# Patient Record
Sex: Male | Born: 1995 | Race: White | Hispanic: No | Marital: Single | Smoking: Never smoker
Health system: Southern US, Community
[De-identification: ages and names within clinical notes are randomized; demographics above are authoritative.]

## PROBLEM LIST (undated history)

## (undated) DIAGNOSIS — F419 Anxiety disorder, unspecified: Secondary | ICD-10-CM

## (undated) HISTORY — PX: KNEE SURGERY: SHX244

## (undated) HISTORY — DX: Anxiety disorder, unspecified: F41.9

---
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
## 2018-01-04 ENCOUNTER — Ambulatory Visit
Admission: EM | Admit: 2018-01-04 | Discharge: 2018-01-04 | Disposition: A | Discharge status: Home / Self Care | Payer: Managed Care, Other (non HMO) | Attending: Family Medicine | Admitting: Family Medicine

## 2018-01-04 DIAGNOSIS — R51 Headache: Secondary | ICD-10-CM

## 2018-01-04 DIAGNOSIS — R509 Fever, unspecified: Secondary | ICD-10-CM | POA: Diagnosis not present

## 2018-01-04 DIAGNOSIS — R110 Nausea: Secondary | ICD-10-CM | POA: Diagnosis not present

## 2018-01-04 DIAGNOSIS — R5383 Other fatigue: Secondary | ICD-10-CM | POA: Diagnosis not present

## 2018-01-04 MED ORDER — DOXYCYCLINE HYCLATE 100 MG PO CAPS: 100.0000 mg | ORAL_CAPSULE | Freq: Two times a day (BID) | ORAL | 0 refills | Status: DC

## 2018-01-04 NOTE — ED Triage Notes (Signed)
Pt has been feeling bad for the past 3 days along with fever. Did take ibuprofen without relief. Headache and fatigue. Bit by a tick the other day, states the symptoms started right after that. Did have [place] fever before when he was younger.

## 2018-01-04 NOTE — ED Provider Notes (Signed)
[facility]  CSN: [id number] Arrival date & time: 01/04/18  0804  History   Chief Complaint Chief Complaint  Patient presents with  . Fever   HPI  22 year old male presents with fever.  Patient reports a 3-day history of not feeling well.  He states that he found a tick on him but does not report an actual bite.  He has had a low-grade fever which spiked last night to 102.2.  He reports headache, nausea, continued fever, and body aches.  He is concerned that he may have [facility] spotted fever.  He has taken ibuprofen and took a cold shower without resolution.  No known exacerbating factors.  No other associated symptoms.  No other complaints.  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 KNEE SURGERY      Home Medications    Prior to Admission medications   Medication Sig Start Date End Date Taking? Authorizing Provider  doxycycline (VIBRAMYCIN) 100 MG capsule Take 1 capsule (100 mg total) by mouth 2 (two) times daily. 01/04/18   Tommie Sams, DO   Family History No reported family hx.  Social History Social History   Tobacco Use  . Smoking status: Never Smoker  . Smokeless tobacco: Never Used  Substance Use Topics  . Alcohol use: Not on file  . Drug use: Not on file   Allergies   Patient has no known allergies.  Review of Systems Review of Systems  Constitutional: Positive for fatigue and fever.  Gastrointestinal: Positive for nausea.  Musculoskeletal:       Body aches.   Neurological: Positive for headaches.   Physical Exam Triage Vital Signs ED Triage Vitals  Enc Vitals Group     BP 01/04/18 0818 135/74     Pulse Rate 01/04/18 0818 (!) 105     Resp 01/04/18 0818 18     Temp 01/04/18 0818 99.7 F (37.6 C)     Temp Source 01/04/18 0818 Oral     SpO2 01/04/18 0818 98 %     Weight 01/04/18 0820 160 lb (72.6 kg)     Height --      Head Circumference --      Peak Flow --      Pain Score 01/04/18  0820 6     Pain Loc --      Pain Edu? --      Excl. in GC? --    Updated Vital Signs BP 135/74 (BP Location: Left Arm)   Pulse (!) 105   Temp 99.7 F (37.6 C) (Oral)   Resp 18   Wt 160 lb (72.6 kg)   SpO2 98%     Physical Exam  Constitutional: He is oriented to person, place, and time. He appears well-developed. No distress.  HENT:  Head: Normocephalic and atraumatic.  Eyes: Conjunctivae are normal. Right eye exhibits no discharge. Left eye exhibits no discharge. No scleral icterus.  Cardiovascular: Regular rhythm.  Tachycardic.  Pulmonary/Chest: Effort normal. He has no wheezes. He has no rales.  Neurological: He is alert and oriented to person, place, and time.  Psychiatric: He has a normal mood and affect. His behavior is normal.  Nursing note and vitals reviewed.  UC Treatments / Results  Labs (all labs ordered are listed, but only abnormal results are displayed) Labs Reviewed - No data to display  EKG None  Radiology No results found.  Procedures Procedures (including critical care time)  Medications Ordered  in UC Medications - No data to display  Initial Impression / Assessment and Plan / UC Course  I have reviewed the triage vital signs and the nursing notes.  Pertinent labs & imaging results that were available during my care of the patient were reviewed by me and considered in my medical decision making (see chart for details).    22 year old male presents with a febrile illness.  Patient concerned about RMSF.  Etiology is unclear at this time.  I am placing him on doxycycline given tick exposure and his symptomatology.  Final Clinical Impressions(s) / UC Diagnoses   Final diagnoses:  Febrile illness     Discharge Instructions     Antibiotic as prescribed.  Take care  Dr. Adriana Simas    ED Prescriptions    Medication Sig Dispense Auth. Provider   doxycycline (VIBRAMYCIN) 100 MG capsule Take 1 capsule (100 mg total) by mouth 2 (two) times daily.  20 capsule Tommie Sams, DO     Controlled Substance Prescriptions [place] Controlled Substance Registry consulted? Not Applicable   Tommie Sams, [place] 01/04/18 [id number]

## 2018-01-04 NOTE — Discharge Instructions (Signed)
Antibiotic as prescribed.  Take care  Dr. Alaya Iverson  

## 2018-09-15 ENCOUNTER — Ambulatory Visit
Admission: EM | Admit: 2018-09-15 | Discharge: 2018-09-15 | Disposition: A | Discharge status: Home / Self Care | Payer: Managed Care, Other (non HMO) | Attending: Family Medicine | Admitting: Family Medicine

## 2018-09-15 ENCOUNTER — Other Ambulatory Visit: Payer: Self-pay

## 2018-09-15 ENCOUNTER — Encounter: Payer: Self-pay | Admitting: Gynecology

## 2018-09-15 DIAGNOSIS — Z20818 Contact with and (suspected) exposure to other bacterial communicable diseases: Secondary | ICD-10-CM | POA: Insufficient documentation

## 2018-09-15 LAB — RAPID STREP SCREEN (MED CTR MEBANE ONLY): Streptococcus, Group A Screen (Direct): NEGATIVE

## 2018-09-15 MED ORDER — AMOXICILLIN 875 MG PO TABS: 875.0000 mg | ORAL_TABLET | Freq: Two times a day (BID) | ORAL | 0 refills | Status: DC

## 2018-09-15 NOTE — ED Provider Notes (Signed)
[facility]    CSN: [id number] Arrival date & time: 09/15/18  1234     History   Chief Complaint No chief complaint on file.   HPI Larry Rodriguez is a 22 y.o. male.   HPI  -year-old male presents with a sore throat and fever and chills.  He states that he was exposed to strep when he took his younger sister age 13 to the pediatrician and she was diagnosed with strep throat.  That he has had strep in the past.  No cough.  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      History reviewed. No pertinent past medical history.  There are no active problems to display for this patient.   Past Surgical History:  Procedure Laterality Date  . KNEE SURGERY         Home Medications    Prior to Admission medications   Medication Sig Start Date End Date Taking? Authorizing Provider  acetaminophen (TYLENOL) 325 MG tablet Take by mouth.   Yes [provider]  amoxicillin (AMOXIL) 875 MG tablet Take 1 tablet (875 mg total) by mouth 2 (two) times daily. 09/15/18   Lutricia Feil, PA-C    Family History Family History  Family history unknown: Yes    Social History Social History   Tobacco Use  . Smoking status: Never Smoker  . Smokeless tobacco: Never Used  Substance Use Topics  . Alcohol use: Yes  . Drug use: Never     Allergies   Patient has no known allergies.   Review of Systems Review of Systems  Constitutional: Positive for activity change, chills, fatigue and fever. Negative for appetite change.  HENT: Positive for sore throat.   Respiratory: Negative for cough.   All other systems reviewed and are negative.    Physical Exam Triage Vital Signs ED Triage Vitals  Enc Vitals Group     BP 09/15/18 1250 (!) 89/69     Pulse Rate 09/15/18 1250 95     Resp 09/15/18 1250 18     Temp 09/15/18 1250 99.5 F (37.5 C)     Temp Source 09/15/18 1250 Oral     SpO2 09/15/18 1250 100  %     Weight 09/15/18 1251 175 lb (79.4 kg)     Height 09/15/18 1251 6\' 3"  (1.905 m)     Head Circumference --      Peak Flow --      Pain Score 09/15/18 1251 4     Pain Loc --      Pain Edu? --      Excl. in GC? --    No data found.  Updated Vital Signs BP (!) 89/69 (BP Location: Left Arm)   Pulse 95   Temp 99.5 F (37.5 C) (Oral)   Resp 18   Ht 6\' 3"  (1.905 m)   Wt 175 lb (79.4 kg)   SpO2 100%   BMI 21.87 kg/m   Visual Acuity Right Eye Distance:   Left Eye Distance:   Bilateral Distance:    Right Eye Near:   Left Eye Near:    Bilateral Near:     Physical Exam Vitals signs and nursing note reviewed.  Constitutional:      General: He is not in acute distress.    Appearance: Normal appearance. He is normal weight. He is not ill-appearing, toxic-appearing or diaphoretic.  HENT:     Head:  Normocephalic.     Comments: The patient has bilateral enlarged tonsils.  No erythema or exudate.  Uvula is midline.    Right Ear: Tympanic membrane, ear canal and external ear normal.     Left Ear: Tympanic membrane, ear canal and external ear normal.     Nose: Nose normal.     Mouth/Throat:     Mouth: Mucous membranes are moist.     Pharynx: Oropharynx is clear. No oropharyngeal exudate or posterior oropharyngeal erythema.  Eyes:     General:        Right eye: No discharge.        Left eye: No discharge.     Conjunctiva/sclera: Conjunctivae normal.  Neck:     Musculoskeletal: Normal range of motion and neck supple.  Pulmonary:     Effort: Pulmonary effort is normal.     Breath sounds: Normal breath sounds.  Musculoskeletal: Normal range of motion.  Lymphadenopathy:     Cervical: No cervical adenopathy.  Skin:    General: Skin is warm and dry.  Neurological:     General: No focal deficit present.     Mental Status: He is alert and oriented to person, place, and time.  Psychiatric:        Mood and Affect: Mood normal.        Behavior: Behavior normal.        Thought  Content: Thought content normal.        Judgment: Judgment normal.      UC Treatments / Results  Labs (all labs ordered are listed, but only abnormal results are displayed) Labs Reviewed  RAPID STREP SCREEN ([facility] ONLY)  CULTURE, GROUP A STREP [facility])    EKG None  Radiology No results found.  Procedures Procedures (including critical care time)  Medications Ordered in UC Medications - No data to display  Initial Impression / Assessment and Plan / UC Course  I have reviewed the triage vital signs and the nursing notes.  Pertinent labs & imaging results that were available during my care of the patient were reviewed by me and considered in my medical decision making (see chart for details).   This is a 23 year old male has a positive exposure to strep when he took his younger sister age 79 to pediatrician.  History of recurrent strep infections.  Empirically place him on amoxicillin awaiting cultures and sensitivities.  Counseled him regarding use of ibuprofen salt water gargles and lozenges as necessary for comfort.   Final Clinical Impressions(s) / UC Diagnoses   Final diagnoses:  Exposure to strep throat   Discharge Instructions   None    ED Prescriptions    Medication Sig Dispense Auth. Provider   amoxicillin (AMOXIL) 875 MG tablet Take 1 tablet (875 mg total) by mouth 2 (two) times daily. 20 tablet Lutricia Feil, PA-C     Controlled Substance Prescriptions [place] Controlled Substance Registry consulted? Not Applicable   Lutricia Feil, PA-C 09/15/18 1429

## 2018-09-15 NOTE — ED Triage Notes (Signed)
Patient c/o sore throat. Per patient younger sister with diagnose with strep.

## 2018-09-18 LAB — CULTURE, GROUP A STREP (THRC)

## 2019-08-27 ENCOUNTER — Other Ambulatory Visit: Payer: Self-pay | Admitting: Adult Health

## 2019-08-27 ENCOUNTER — Other Ambulatory Visit: Payer: Self-pay

## 2019-08-27 ENCOUNTER — Telehealth: Payer: Self-pay | Admitting: Adult Health

## 2019-08-27 ENCOUNTER — Ambulatory Visit (INDEPENDENT_AMBULATORY_CARE_PROVIDER_SITE_OTHER): Payer: Managed Care, Other (non HMO) | Admitting: Adult Health

## 2019-08-27 ENCOUNTER — Encounter: Payer: Self-pay | Admitting: Adult Health

## 2019-08-27 VITALS — BP 136/88 | HR 75 | Temp 96.8°F | Resp 16 | Ht 76.0 in | Wt 194.4 lb

## 2019-08-27 DIAGNOSIS — Z23 Encounter for immunization: Secondary | ICD-10-CM | POA: Diagnosis not present

## 2019-08-27 DIAGNOSIS — Q8740 Marfan's syndrome, unspecified: Secondary | ICD-10-CM

## 2019-08-27 DIAGNOSIS — Z0000 Encounter for general adult medical examination without abnormal findings: Secondary | ICD-10-CM | POA: Diagnosis not present

## 2019-08-27 DIAGNOSIS — F419 Anxiety disorder, unspecified: Secondary | ICD-10-CM | POA: Insufficient documentation

## 2019-08-27 MED ORDER — ESCITALOPRAM OXALATE 10 MG PO TABS: 10.0000 mg | ORAL_TABLET | Freq: Every day | ORAL | 0 refills | Status: DC

## 2019-08-27 NOTE — Telephone Encounter (Signed)
See note below KW 

## 2019-08-27 NOTE — Progress Notes (Signed)
Patient: Larry Rodriguez, Male    DOB: March 15, 1996, 24 y.o.   MRN: [id number] Visit Date: 08/27/2019  Today's Provider: Marcille Buffy, FNP   Chief Complaint  Patient presents with  . New Patient (Initial Visit)   Subjective:    New Patient Larry Rodriguez is a 24 y.o. male who presents today for health maintenance and establish care as a new patient. He feels well. He reports exercising daily. He reports he is sleeping fairly well.  Patient comes to the clinic for establishment of primary care, he would like to have labs drawn today, he reports he is fasting.  He has no concerns except for that he has been having occasional anxiety attacks, and feeling overwhelmed.  He most recently bought a new house, purchased a new car, became engaged, and has been stressed with the COVID-19 pandemic this past year. He denies any palpitations.  Denies any shortness of breath. Denies any dyspnea.  Patient  denies any fever, body aches,chills, rash, chest pain, shortness of breath, nausea, vomiting, or diarrhea.  He denies any changing skin lesions or concerns. Denies any chest pain or chest tightness.  Eye exam and dentist yearly. Wears glasses.   He has a history of Marfan's documented.  He denies any concerns or changes. Denies any pain at today's visit. He keeps follow up's with specialist at [facility] and sees orthopedics.  Reviewed imaging prior patient had at [facility].  Patellar instability of left knee (Primary Dx);  Marfan's syndrome with skeletal manifestation; 24 y.o. male who follows-up status post left tibial tubercle osteotomy with medial plica excision, medial patellofemoral ligament reconstruction on 09/16/13. Past Surgical History  Procedure Laterality Date  . Dental surgery 2006  . Reconstruction dislocating patella Left 09/16/2013  Procedure: RECONSTRUCTION DISLOCATING PATELLA; Surgeon: Amador Cunas, MD; Location: [facility] OR; Service: Orthopedics; Laterality: Left;  .  Tubercleplasty anterior tibia Left 09/16/2013  Procedure: TUBERCLEPLASTY ANTERIOR TIBIA; Surgeon: Amador Cunas, MD; Location: [facility] OR; Service: Orthopedics; Laterality: Left;  . Arthroscopy knee Left 09/16/2013  Procedure: KNEE SCOPE,PART SYNOVECT; Surgeon: Amador Cunas, MD; Location: [facility] OR; Service: Orthopedics; Laterality: Left;   Interface, Rad Results In - 10/13/2014  5:14 PM EST 2 views of the left tibia.  Indication: Pain.  Comparison: 12/08/2013.  Findings: Stable appearance of 2 screws in the proximal left tibia, no evidence for hardware failure. Apparent osteopenia is identified in the region of the screws on lateral view only, is without correlate on the frontal view and favored artifactual. Old screw tracks are identified in the distal femur and patella. Regional soft tissues are unremarkable. 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  Impression: Stable postsurgical changes of the left tibia without evidence for hardware failure.  Electronically Reviewed by:  Henrine Screws, MD Electronically Reviewed on:  10/13/2014 5:08 PM  I have reviewed the images and concur with the above findings.  Electronically Signed by:  Berna Spare, MD Electronically Signed on:  10/13/2014 5:11 PM [facility]                       [address]                      [place], [place] [address]                     Phone [phone]  Fax [phone]                Pediatric Echo Transthoracic Report  Name: Larry Rodriguez Date: 03/07/2010 10:04 AM MRN: [id number]         Patient Location: Pediatric Echo Lab     Height: 170 cm DOB: [birth year]       Gender: Male                 Weight: 41 kg Age: 78 yrs                                      BSA:  1.4 meters2 Reason For Study: 759.82 MARFAN SYNDROME Performed By: MD Donnetta Hail  INTERPRETATION SUMMARY  No cardiac disease identified.  CARDIAC POSITION  Levocardia. Abdominal situs solitus. Atrial situs solitus. D Ventricular Loop. S Normal  position great vessels.  VEINS  Normal systemic venous connections. At least two pulmonary veins are seen connecting to  the left atrium. Normal pulmonary vein velocity.  ATRIA  Normal right atrial size. Normal left atrial size. The atrial septum is not well seen.  ATRIOVENTRICULAR VALVES  Normal tricuspid valve. Normal tricuspid valve inflow velocity. Trace tricuspid valve  insufficiency. Normal mitral valve. Normal mitral valve inflow velocity. No mitral valve  insufficiency.  VENTRICLES  Normal right ventricle structure and size. Normal left ventricle structure and size.  Intact ventricular septum.  CARDIAC FUNCTION  Normal right ventricular systolic function. Normal left ventricular systolic function.  SEMILUNAR VALVES  Normal pulmonic valve. Trivial pulmonary valve insufficiency. Normal pulmonic valve  velocity. Aortic valve mobility appears normal. Normal aortic valve velocity by Doppler.  No aortic valve insufficiency by color Doppler.  CORONARY ARTERIES  Normal origin and proximal course of the right coronary artery with prograde flow  demonstrated by color Doppler. Normal origin and proximal course of the left coronary  artery with prograde flow demonstrated by color Doppler.  GREAT ARTERIES  Left aortic arch with normal branching pattern. No evidence of coarctation of the aorta.  Normal pulmonary artery branches.  SHUNTS  No patent ductus arteriosus.  Patient  denies any fever, body aches,chills, rash, chest pain, shortness of breath, nausea, vomiting, or diarrhea.   -----------------------------------------------------------------   Review of Systems  Constitutional: Negative.         History of Marfan's reports followed at [facility]. No changes or concerns. No pain at today's visit.   HENT: Negative.   Eyes: Negative.   Respiratory: Negative.   Cardiovascular: Negative.   Gastrointestinal: Negative.   Endocrine: Negative.   Genitourinary: Negative.   Musculoskeletal: Negative.   Skin: Negative.   Allergic/Immunologic: Negative.   Neurological: Negative.   Hematological: Negative.   Psychiatric/Behavioral: Positive for decreased concentration. Negative for agitation, behavioral problems, confusion, dysphoric mood, hallucinations, self-injury, sleep disturbance and suicidal ideas. The patient is nervous/anxious. The patient is not hyperactive.        Anxiety attack- overwhelmed with new house ,new car, just engaged, new job,  Feels overwhelmed at time.  Denies any suicidal or homicidal ideations or intents.   All other systems reviewed and are negative.   Social History He  reports that he has never smoked. He has never used smokeless tobacco. He reports current alcohol use of about 2.0 standard drinks of alcohol per week. He reports that he does not use drugs. Social History   Socioeconomic History  .  Marital status: Single    Spouse name: Not on file  . Number of children: Not on file  . Years of education: Not on file  . Highest education level: Not on file  Occupational History  . Not on file  Tobacco Use  . Smoking status: Never Smoker  . Smokeless tobacco: Never Used  Substance and Sexual Activity  . Alcohol use: Yes    Alcohol/week: 2.0 standard drinks    Types: 2 Cans of beer per week  . Drug use: Never  . Sexual activity: Not on file  Other Topics Concern  . Not on file  Social History Narrative  . Not on file   Social Determinants of Health   Financial Resource Strain:   . Difficulty of Paying Living Expenses: Not on file  Food Insecurity:   . Worried About Charity fundraiser in the Last Year: Not on file  . Ran Out of Food in the Last  Year: Not on file  Transportation Needs:   . Lack of Transportation (Medical): Not on file  . Lack of Transportation (Non-Medical): Not on file  Physical Activity:   . Days of Exercise per Week: Not on file  . Minutes of Exercise per Session: Not on file  Stress:   . Feeling of Stress : Not on file  Social Connections:   . Frequency of Communication with Friends and Family: Not on file  . Frequency of Social Gatherings with Friends and Family: Not on file  . Attends Religious Services: Not on file  . Active Member of Clubs or Organizations: Not on file  . Attends Archivist Meetings: Not on file  . Marital Status: Not on file    Patient Active Problem List   Diagnosis Date Noted  . Anxiety 08/27/2019  . Routine health maintenance 08/27/2019  . Pain in shin, right 10/16/2014  . 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 KNEE SURGERY      Family History  Family Status  Relation Name Status  . Mother  Alive  . Father  Alive  . MGM  (Not Specified)  . MGF  (Not Specified)   His family history includes Cancer in his maternal grandfather; Congestive Heart Failure in his maternal grandmother; Diabetes in his maternal grandmother.     No Known Allergies  Previous Medications   No medications on file    Patient Care Team: Doreen Beam, FNP as PCP - General (Family Medicine)      Objective:   Vitals: Blood pressure 136/88, pulse 75, temperature (!) 96.8 F (36 C), temperature source Oral, resp. rate 16, height 6' 4"  (1.93 m), weight 194 lb 6.4 oz (88.2 kg), SpO2 98 %.   Physical Exam Vitals and nursing note reviewed.  Constitutional:      General: He is not in acute distress.    Appearance: Normal appearance. He is well-developed. He is not ill-appearing, toxic-appearing or diaphoretic.     Comments: Patient is alert and oriented and responsive to  questions Engages in eye contact with provider. Speaks in full sentences without any pauses without any shortness of breath or distress.   Tall and thin.    HENT:     Head: Normocephalic and atraumatic.     Right Ear: Hearing, tympanic membrane, ear canal and external ear normal.     Left Ear: Hearing, tympanic membrane, ear canal and  external ear normal.     Nose: Nose normal.     Mouth/Throat:     Lips: Pink.     Mouth: Mucous membranes are moist.     Palate: No mass.     Pharynx: Oropharynx is clear. Uvula midline. No oropharyngeal exudate.     Tonsils: 1+ on the right. 1+ on the left.  Eyes:     General: Lids are normal. No scleral icterus.       Right eye: No discharge.        Left eye: No discharge.     Conjunctiva/sclera: Conjunctivae normal.     Pupils: Pupils are equal, round, and reactive to light.  Neck:     Thyroid: No thyromegaly.     Vascular: Normal carotid pulses. No carotid bruit, hepatojugular reflux or JVD.     Trachea: Trachea and phonation normal. No tracheal tenderness or tracheal deviation.     Meningeal: Brudzinski's sign absent.  Cardiovascular:     Rate and Rhythm: Normal rate and regular rhythm.     Pulses: Normal pulses.     Heart sounds: Normal heart sounds, S1 normal and S2 normal. Heart sounds not distant. No murmur. No friction rub. No gallop.   Pulmonary:     Effort: Pulmonary effort is normal. No accessory muscle usage or respiratory distress.     Breath sounds: Normal breath sounds. No stridor. No wheezing or rales.  Chest:     Chest wall: No tenderness.  Abdominal:     General: Bowel sounds are normal. There is no distension.     Palpations: Abdomen is soft. There is no mass.     Tenderness: There is no abdominal tenderness. There is no guarding or rebound.     Hernia: No hernia is present.  Musculoskeletal:        General: No tenderness or deformity. Normal range of motion.     Cervical back: Full passive range of motion without pain,  normal range of motion and neck supple.     Comments: Patient moves on and off of exam table and in room without difficulty. Gait is normal in hall and in room. Patient is oriented to person place time and situation. Patient answers questions appropriately and engages in conversation.   Lymphadenopathy:     Head:     Right side of head: No submental, submandibular, tonsillar, preauricular, posterior auricular or occipital adenopathy.     Left side of head: No submental, submandibular, tonsillar, preauricular, posterior auricular or occipital adenopathy.     Cervical: No cervical adenopathy.  Skin:    General: Skin is warm and dry.     Capillary Refill: Capillary refill takes less than 2 seconds.     Coloration: Skin is not pale.     Findings: No erythema or rash.     Nails: There is no clubbing.  Neurological:     Mental Status: He is alert and oriented to person, place, and time.     GCS: GCS eye subscore is 4. GCS verbal subscore is 5. GCS motor subscore is 6.     Cranial Nerves: No cranial nerve deficit.     Sensory: No sensory deficit.     Motor: No abnormal muscle tone.     Coordination: Coordination normal.     Gait: Gait normal.     Deep Tendon Reflexes: Reflexes are normal and symmetric. Reflexes normal.  Psychiatric:        Speech: Speech normal.  Behavior: Behavior normal.        Thought Content: Thought content normal.        Judgment: Judgment normal.      Depression Screen PHQ 2/9 Scores 08/27/2019  PHQ - 2 Score 3  PHQ- 9 Score 6      Assessment & Plan:     Routine Health Maintenance and Physical Exam  Exercise Activities and Dietary recommendations Goals   None     Immunization History  Administered Date(s) Administered  . DTaP 04/07/1997, 06/08/1997, 08/11/1997, 04/30/1998, 06/18/2001  . HPV Quadrivalent 03/01/2010  . Hepatitis A 04/13/2008, 06/17/2009  . Hepatitis B 09[birth year]11[birth year]96, 03/05/1997, 10/28/1997  . HiB (PRP-OMP) 04/07/1997,  06/08/1997, 04/30/1998  . IPV 04/07/1997, 06/08/1997, 01/29/1998, 06/18/2001  . Influenza,inj,Quad PF,6+ Mos 08/27/2019  . MMR 01/29/1998, 06/18/2001  . Meningococcal Conjugate 04/13/2008  . Tdap 04/13/2008  . Varicella 01/29/1998, 05/25/2008    Health Maintenance  Topic Date Due  . HIV Screening  01/28/2012  . TETANUS/TDAP  04/13/2018  . INFLUENZA VACCINE  Completed     Discussed health benefits of physical activity, and encouraged him to engage in regular exercise appropriate for his age and condition.     1. Routine health maintenance - Comprehensive Metabolic Panel (CMET) - Lipid Panel w/o Chol/HDL Ratio - TSH - CBC with Differential/Platelet 005009  2. Need for influenza vaccination Given VIS and given by CMA.  - 3. Anxiety Meds ordered this encounter  Medications  . escitalopram (LEXAPRO) 10 MG tablet    Sig: Take 1 tablet (10 mg total) by mouth daily.    Dispense:  35 tablet    Refill:  0  discussed black box warning and when to seek care immediately.  He will go to counseling.   - Comprehensive Metabolic Panel (CMET) - Lipid Panel w/o Chol/HDL Ratio - TSH - CBC with Differential/Platelet 005009  4. Marfan syndrome Report any changes. Keep follow up with your specialist at [facility]. Yearly eye exam. Dental exam. Monitor blood pressure. Report any new symptoms.   Please schedule a follow up appointment in 1 month.  Advised patient call the office or your primary care doctor for an appointment if no improvement within 72 hours or if any symptoms change or worsen at any time  Advised ER or urgent Care if after hours or on weekend. Call 911 for emergency symptoms at any time.Patinet verbalized understanding of all instructions given/reviewed and treatment plan and has no further questions or concerns at this time.     The entirety of the information documented in the History of Present Illness, Review of Systems and Physical Exam were personally obtained by me.  Portions of this information were initially documented by the  Certified Medical Assistant whose name is documented in [place] and reviewed by me for thoroughness and accuracy.  I have personally performed the exam and reviewed the chart and it is accurate to the best of my knowledge.  Haematologist has been used and any errors in dictation or transcription are unintentional.  Kelby Aline. [facility]  --------------------------------------------------------------------

## 2019-08-27 NOTE — Telephone Encounter (Signed)
Pt called and stated that he would like a call back from [facility] regarding medication escitalopram (LEXAPRO) 10 MG tablet [[id number]].

## 2019-08-27 NOTE — Patient Instructions (Addendum)
[facility] in [place] - you can schedule online for recommended  counseling.    Escitalopram tablets What is this medicine? ESCITALOPRAM (es sye TAL oh pram) is used to treat depression and certain types of anxiety. This medicine may be used for other purposes; ask your health care provider or pharmacist if you have questions. COMMON BRAND NAME(S): Lexapro What should I tell my health care provider before I take this medicine? They need to know if you have any of these conditions:  bipolar disorder or a family history of bipolar disorder  diabetes  glaucoma  heart disease  kidney or liver disease  receiving electroconvulsive therapy  seizures (convulsions)  suicidal thoughts, plans, or attempt by you or a family member  an unusual or allergic reaction to escitalopram, the related drug citalopram, other medicines, foods, dyes, or preservatives  pregnant or trying to become pregnant  breast-feeding How should I use this medicine? Take this medicine by mouth with a glass of water. Follow the directions on the prescription label. You can take it with or without food. If it upsets your stomach, take it with food. Take your medicine at regular intervals. Do not take it more often than directed. Do not stop taking this medicine suddenly except upon the advice of your doctor. Stopping this medicine too quickly may cause serious side effects or your condition may worsen. A special MedGuide will be given to you by the pharmacist with each prescription and refill. Be sure to read this information carefully each time. Talk to your pediatrician regarding the use of this medicine in children. Special care may be needed. Overdosage: If you think you have taken too much of this medicine contact a poison control center or emergency room at once. NOTE: This medicine is only for you. Do not share this medicine with others. What if I miss a dose? If you miss a dose, take it as soon as  you can. If it is almost time for your next dose, take only that dose. Do not take double or extra doses. What may interact with this medicine? Do not take this medicine with any of the following medications:  certain medicines for fungal infections like fluconazole, itraconazole, ketoconazole, posaconazole, voriconazole  cisapride  citalopram  dronedarone  linezolid  MAOIs like Carbex, Eldepryl, Marplan, Nardil, and Parnate  methylene blue (injected into a vein)  pimozide  thioridazine This medicine may also interact with the following medications:  alcohol  amphetamines  aspirin and aspirin-like medicines  carbamazepine  certain medicines for depression, anxiety, or psychotic disturbances  certain medicines for migraine headache like almotriptan, eletriptan, frovatriptan, naratriptan, rizatriptan, sumatriptan, zolmitriptan  certain medicines for sleep  certain medicines that treat or prevent blood clots like warfarin, enoxaparin, dalteparin  cimetidine  diuretics  dofetilide  fentanyl  furazolidone  isoniazid  lithium  metoprolol  NSAIDs, medicines for pain and inflammation, like ibuprofen or naproxen  other medicines that prolong the QT interval (cause an abnormal heart rhythm)  procarbazine  rasagiline  supplements like St. John's wort, kava kava, valerian  tramadol  tryptophan  ziprasidone This list may not describe all possible interactions. Give your health care provider a list of all the medicines, herbs, non-prescription drugs, or dietary supplements you use. Also tell them if you smoke, drink alcohol, or use illegal drugs. Some items may interact with your medicine. What should I watch for while using this medicine? Tell your doctor if your symptoms do not get better or if they get  worse. Visit your doctor or health care professional for regular checks on your progress. Because it may take several weeks to see the full effects of this  medicine, it is important to continue your treatment as prescribed by your doctor. Patients and their families should watch out for new or worsening thoughts of suicide or depression. Also watch out for sudden changes in feelings such as feeling anxious, agitated, panicky, irritable, hostile, aggressive, impulsive, severely restless, overly excited and hyperactive, or not being able to sleep. If this happens, especially at the beginning of treatment or after a change in dose, call your health care professional. Bonita Quin may get drowsy or dizzy. Do not drive, use machinery, or do anything that needs mental alertness until you know how this medicine affects you. Do not stand or sit up quickly, especially if you are an older patient. This reduces the risk of dizzy or fainting spells. Alcohol may interfere with the effect of this medicine. Avoid alcoholic drinks. Your mouth may get dry. Chewing sugarless gum or sucking hard candy, and drinking plenty of water may help. Contact your doctor if the problem does not go away or is severe. What side effects may I notice from receiving this medicine? Side effects that you should report to your doctor or health care professional as soon as possible:  allergic reactions like skin rash, itching or hives, swelling of the face, lips, or tongue  anxious  black, tarry stools  changes in vision  confusion  elevated mood, decreased need for sleep, racing thoughts, impulsive behavior  eye pain  fast, irregular heartbeat  feeling faint or lightheaded, falls  feeling agitated, angry, or irritable  hallucination, loss of contact with reality  loss of balance or coordination  loss of memory  painful or prolonged erections  restlessness, pacing, inability to keep still  seizures  stiff muscles  suicidal thoughts or other mood changes  trouble sleeping  unusual bleeding or bruising  unusually weak or tired  vomiting Side effects that usually do not  require medical attention (report to your doctor or health care professional if they continue or are bothersome):  changes in appetite  change in sex drive or performance  headache  increased sweating  indigestion, nausea  tremors This list may not describe all possible side effects. Call your doctor for medical advice about side effects. You may report side effects to FDA at 1-800-FDA-1088. Where should I keep my medicine? Keep out of reach of children. Store at room temperature between 15 and 30 degrees C (59 and 86 degrees F). Throw away any unused medicine after the expiration date. NOTE: This sheet is a summary. It may not cover all possible information. If you have questions about this medicine, talk to your doctor, pharmacist, or health care provider.  2020 Elsevier/Gold Standard (2018-07-29 11:21:44)   Generalized Anxiety Disorder, Adult Generalized anxiety disorder (GAD) is a mental health disorder. People with this condition constantly worry about everyday events. Unlike normal anxiety, worry related to GAD is not triggered by a specific event. These worries also do not fade or get better with time. GAD interferes with life functions, including relationships, work, and school. GAD can vary from mild to severe. People with severe GAD can have intense waves of anxiety with physical symptoms (panic attacks). What are the causes? The exact cause of GAD is not known. What increases the risk? This condition is more likely to develop in:  Women.  People who have a family history of anxiety  disorders.  People who are very shy.  People who experience very stressful life events, such as the death of a loved one.  People who have a very stressful family environment. What are the signs or symptoms? People with GAD often worry excessively about many things in their lives, such as their health and family. They may also be overly concerned about:  Doing well at work.  Being on  time.  Natural disasters.  Friendships. Physical symptoms of GAD include:  Fatigue.  Muscle tension or having muscle twitches.  Trembling or feeling shaky.  Being easily startled.  Feeling like your heart is pounding or racing.  Feeling out of breath or like you cannot take a deep breath.  Having trouble falling asleep or staying asleep.  Sweating.  Nausea, diarrhea, or irritable bowel syndrome (IBS).  Headaches.  Trouble concentrating or remembering facts.  Restlessness.  Irritability. How is this diagnosed? Your health care provider can diagnose GAD based on your symptoms and medical history. You will also have a physical exam. The health care provider will ask specific questions about your symptoms, including how severe they are, when they started, and if they come and go. Your health care provider may ask you about your use of alcohol or drugs, including prescription medicines. Your health care provider may refer you to a mental health specialist for further evaluation. Your health care provider will do a thorough examination and may perform additional tests to rule out other possible causes of your symptoms. To be diagnosed with GAD, a person must have anxiety that:  Is out of his or her control.  Affects several different aspects of his or her life, such as work and relationships.  Causes distress that makes him or her unable to take part in normal activities.  Includes at least three physical symptoms of GAD, such as restlessness, fatigue, trouble concentrating, irritability, muscle tension, or sleep problems. Before your health care provider can confirm a diagnosis of GAD, these symptoms must be present more days than they are not, and they must last for six months or longer. How is this treated? The following therapies are usually used to treat GAD:  Medicine. Antidepressant medicine is usually prescribed for long-term daily control. Antianxiety medicines may  be added in severe cases, especially when panic attacks occur.  Talk therapy (psychotherapy). Certain types of talk therapy can be helpful in treating GAD by providing support, education, and guidance. Options include: ? Cognitive behavioral therapy (CBT). People learn coping skills and techniques to ease their anxiety. They learn to identify unrealistic or negative thoughts and behaviors and to replace them with positive ones. ? Acceptance and commitment therapy (ACT). This treatment teaches people how to be mindful as a way to cope with unwanted thoughts and feelings. ? Biofeedback. This process trains you to manage your body's response (physiological response) through breathing techniques and relaxation methods. You will work with a therapist while machines are used to monitor your physical symptoms.  Stress management techniques. These include yoga, meditation, and exercise. A mental health specialist can help determine which treatment is best for you. Some people see improvement with one type of therapy. However, other people require a combination of therapies. Follow these instructions at home:  Take over-the-counter and prescription medicines only as told by your health care provider.  Try to maintain a normal routine.  Try to anticipate stressful situations and allow extra time to manage them.  Practice any stress management or self-calming techniques as taught by  your health care provider.  Do not punish yourself for setbacks or for not making progress.  Try to recognize your accomplishments, even if they are small.  Keep all follow-up visits as told by your health care provider. This is important. Contact a health care provider if:  Your symptoms do not get better.  Your symptoms get worse.  You have signs of depression, such as: ? A persistently sad, cranky, or irritable mood. ? Loss of enjoyment in activities that used to bring you joy. ? Change in weight or  eating. ? Changes in sleeping habits. ? Avoiding friends or family members. ? Loss of energy for normal tasks. ? Feelings of guilt or worthlessness. Get help right away if:  You have serious thoughts about hurting yourself or others. If you ever feel like you may hurt yourself or others, or have thoughts about taking your own life, get help right away. You can go to your nearest emergency department or call:  Your local emergency services (911 in the [place]  A suicide crisis helpline, such as the National Suicide Prevention Lifeline at (864)415-57061-717-476-6007. This is open 24 hours a day. Summary  Generalized anxiety disorder (GAD) is a mental health disorder that involves worry that is not triggered by a specific event.  People with GAD often worry excessively about many things in their lives, such as their health and family.  GAD may cause physical symptoms such as restlessness, trouble concentrating, sleep problems, frequent sweating, nausea, diarrhea, headaches, and trembling or muscle twitching.  A mental health specialist can help determine which treatment is best for you. Some people see improvement with one type of therapy. However, other people require a combination of therapies. This information is not intended to replace advice given to you by your health care provider. Make sure you discuss any questions you have with your health care provider. Document Revised: 07/20/2017 Document Reviewed: 06/27/2016 Elsevier Patient Education  2020 [employer] Health Maintenance, Male Adopting a healthy lifestyle and getting preventive care are important in promoting health and wellness. Ask your health care provider about:  The right schedule for you to have regular tests and exams.  Things you can do on your own to prevent diseases and keep yourself healthy. What should I know about diet, weight, and exercise? Eat a healthy diet   Eat a diet that includes plenty of vegetables, fruits,  low-fat dairy products, and lean protein.  Do not eat a lot of foods that are high in solid fats, added sugars, or sodium. Maintain a healthy weight Body mass index (BMI) is a measurement that can be used to identify possible weight problems. It estimates body fat based on height and weight. Your health care provider can help determine your BMI and help you achieve or maintain a healthy weight. Get regular exercise Get regular exercise. This is one of the most important things you can do for your health. Most adults should:  Exercise for at least 150 minutes each week. The exercise should increase your heart rate and make you sweat (moderate-intensity exercise).  Do strengthening exercises at least twice a week. This is in addition to the moderate-intensity exercise.  Spend less time sitting. Even light physical activity can be beneficial. Watch cholesterol and blood lipids Have your blood tested for lipids and cholesterol at 23 years of age, then have this test every 5 years. You may need to have your cholesterol levels checked more often if:  Your lipid or cholesterol levels are high.  You are older than 23 years of age.  You are at high risk for heart disease. What should I know about cancer screening? Many types of cancers can be detected early and may often be prevented. Depending on your health history and family history, you may need to have cancer screening at various ages. This may include screening for:  Colorectal cancer.  Prostate cancer.  Skin cancer.  Lung cancer. What should I know about heart disease, diabetes, and high blood pressure? Blood pressure and heart disease  High blood pressure causes heart disease and increases the risk of stroke. This is more likely to develop in people who have high blood pressure readings, are of African descent, or are overweight.  Talk with your health care provider about your target blood pressure readings.  Have your blood  pressure checked: ? Every 3-5 years if you are 54-46 years of age. ? Every year if you are 16 years old or older.  If you are between the ages of 39 and 91 and are a current or former smoker, ask your health care provider if you should have a one-time screening for abdominal aortic aneurysm (AAA). Diabetes Have regular diabetes screenings. This checks your fasting blood sugar level. Have the screening done:  Once every three years after age 47 if you are at a normal weight and have a low risk for diabetes.  More often and at a younger age if you are overweight or have a high risk for diabetes. What should I know about preventing infection? Hepatitis B If you have a higher risk for hepatitis B, you should be screened for this virus. Talk with your health care provider to find out if you are at risk for hepatitis B infection. Hepatitis C Blood testing is recommended for:  Everyone born from [birth year] through 1964.  Anyone with known risk factors for hepatitis C. Sexually transmitted infections (STIs)  You should be screened each year for STIs, including gonorrhea and chlamydia, if: ? You are sexually active and are younger than 23 years of age. ? You are older than 23 years of age and your health care provider tells you that you are at risk for this type of infection. ? Your sexual activity has changed since you were last screened, and you are at increased risk for chlamydia or gonorrhea. Ask your health care provider if you are at risk.  Ask your health care provider about whether you are at high risk for HIV. Your health care provider may recommend a prescription medicine to help prevent HIV infection. If you choose to take medicine to prevent HIV, you should first get tested for HIV. You should then be tested every 3 months for as long as you are taking the medicine. Follow these instructions at home: Lifestyle  Do not use any products that contain nicotine or tobacco, such as cigarettes,  e-cigarettes, and chewing tobacco. If you need help quitting, ask your health care provider.  Do not use street drugs.  Do not share needles.  Ask your health care provider for help if you need support or information about quitting drugs. Alcohol use  Do not drink alcohol if your health care provider tells you not to drink.  If you drink alcohol: ? Limit how much you have to 0-2 drinks a day. ? Be aware of how much alcohol is in your drink. In the [place] one drink equals one 12 oz bottle of beer (355 mL), one 5 oz glass of wine (  148 mL), or one 1 oz glass of hard liquor (44 mL). General instructions  Schedule regular health, dental, and eye exams.  Stay current with your vaccines.  Tell your health care provider if: ? You often feel depressed. ? You have ever been abused or do not feel safe at home. Summary  Adopting a healthy lifestyle and getting preventive care are important in promoting health and wellness.  Follow your health care provider's instructions about healthy diet, exercising, and getting tested or screened for diseases.  Follow your health care provider's instructions on monitoring your cholesterol and blood pressure. This information is not intended to replace advice given to you by your health care provider. Make sure you discuss any questions you have with your health care provider. Document Revised: 07/31/2018 Document Reviewed: 07/31/2018 Elsevier Patient Education  2020 [employer].  Testicular Self-Exam A self-exam of your testicles (testicular self-exam) is looking at and feeling your testicles for unusual lumps or swelling. Swelling, lumps, or pain can be caused by:  Injuries.  Puffiness, redness, and soreness (inflammation).  Infection.  Extra fluids around your testicle (hydrocele).  Twisted testicles (testicular torsion).  Cancer of the testicle (testicular cancer). Why is it important to do a self-exam of testicles? You may need to do  self-exams if you are at risk for cancer of the testicles. You may be at risk if you have:  A testicle that has not descended (cryptorchidism).  A history of cancer of the testicle.  A family history of cancer of the testicle. How to do a self-exam of testicles It is easiest to do a self-exam after a warm bath or shower. Testicles are harder to examine when you are cold. A normal testicle is egg-shaped and feels firm. It is smooth, and it is not tender. At the back of your testicles, there is a firm cord that feels like spaghetti (spermatic cord). Look and feel for changes  Stand and hold your penis away from your body.  Look at each testicle to check for lumps or swelling.  Roll each testicle between your thumb and finger. Feel the whole testicle. Feel for: ? Lumps. ? Swelling. ? Discomfort.  Check for swelling or tender bumps in the groin area. Your groin is where your lower belly (abdomen) meets your upper thighs. Contact a health care provider if:  You find a bump or lump. This may be like a small, hard bump that is the size of a pea.  You find swelling.  You find pain.  You find soreness.  You see or feel any other changes. Summary  A self-exam of your testicles is looking at and feeling your testicles for lumps or swelling.  You may need to do self-exams if you are at risk for cancer of the testicle.  You should check each of your testicles for lumps, swelling, or discomfort.  You should check for swelling or tender bumps in the groin area. Your groin is where your lower belly (abdomen) meets your upper thighs. This information is not intended to replace advice given to you by your health care provider. Make sure you discuss any questions you have with your health care provider. Document Revised: 11/28/2018 Document Reviewed: 07/03/2016 Elsevier Patient Education  2020 [employer].  Health Maintenance, Male Adopting a healthy lifestyle and getting preventive  care are important in promoting health and wellness. Ask your health care provider about:  The right schedule for you to have regular tests and exams.  Things you can  do on your own to prevent diseases and keep yourself healthy. What should I know about diet, weight, and exercise? Eat a healthy diet   Eat a diet that includes plenty of vegetables, fruits, low-fat dairy products, and lean protein.  Do not eat a lot of foods that are high in solid fats, added sugars, or sodium. Maintain a healthy weight Body mass index (BMI) is a measurement that can be used to identify possible weight problems. It estimates body fat based on height and weight. Your health care provider can help determine your BMI and help you achieve or maintain a healthy weight. Get regular exercise Get regular exercise. This is one of the most important things you can do for your health. Most adults should:  Exercise for at least 150 minutes each week. The exercise should increase your heart rate and make you sweat (moderate-intensity exercise).  Do strengthening exercises at least twice a week. This is in addition to the moderate-intensity exercise.  Spend less time sitting. Even light physical activity can be beneficial. Watch cholesterol and blood lipids Have your blood tested for lipids and cholesterol at 23 years of age, then have this test every 5 years. You may need to have your cholesterol levels checked more often if:  Your lipid or cholesterol levels are high.  You are older than 23 years of age.  You are at high risk for heart disease. What should I know about cancer screening? Many types of cancers can be detected early and may often be prevented. Depending on your health history and family history, you may need to have cancer screening at various ages. This may include screening for:  Colorectal cancer.  Prostate cancer.  Skin cancer.  Lung cancer. What should I know about heart disease,  diabetes, and high blood pressure? Blood pressure and heart disease  High blood pressure causes heart disease and increases the risk of stroke. This is more likely to develop in people who have high blood pressure readings, are of African descent, or are overweight.  Talk with your health care provider about your target blood pressure readings.  Have your blood pressure checked: ? Every 3-5 years if you are 4218-23 years of age. ? Every year if you are 23 years old or older.  If you are between the ages of 8765 and 6075 and are a current or former smoker, ask your health care provider if you should have a one-time screening for abdominal aortic aneurysm (AAA). Diabetes Have regular diabetes screenings. This checks your fasting blood sugar level. Have the screening done:  Once every three years after age 23 if you are at a normal weight and have a low risk for diabetes.  More often and at a younger age if you are overweight or have a high risk for diabetes. What should I know about preventing infection? Hepatitis B If you have a higher risk for hepatitis B, you should be screened for this virus. Talk with your health care provider to find out if you are at risk for hepatitis B infection. Hepatitis C Blood testing is recommended for:  Everyone born from [birth year] through 1964.  Anyone with known risk factors for hepatitis C. Sexually transmitted infections (STIs)  You should be screened each year for STIs, including gonorrhea and chlamydia, if: ? You are sexually active and are younger than 23 years of age. ? You are older than 23 years of age and your health care provider tells you that you are at  risk for this type of infection. ? Your sexual activity has changed since you were last screened, and you are at increased risk for chlamydia or gonorrhea. Ask your health care provider if you are at risk.  Ask your health care provider about whether you are at high risk for HIV. Your health care  provider may recommend a prescription medicine to help prevent HIV infection. If you choose to take medicine to prevent HIV, you should first get tested for HIV. You should then be tested every 3 months for as long as you are taking the medicine. Follow these instructions at home: Lifestyle  Do not use any products that contain nicotine or tobacco, such as cigarettes, e-cigarettes, and chewing tobacco. If you need help quitting, ask your health care provider.  Do not use street drugs.  Do not share needles.  Ask your health care provider for help if you need support or information about quitting drugs. Alcohol use  Do not drink alcohol if your health care provider tells you not to drink.  If you drink alcohol: ? Limit how much you have to 0-2 drinks a day. ? Be aware of how much alcohol is in your drink. In the [place] one drink equals one 12 oz bottle of beer (355 mL), one 5 oz glass of wine (148 mL), or one 1 oz glass of hard liquor (44 mL). General instructions  Schedule regular health, dental, and eye exams.  Stay current with your vaccines.  Tell your health care provider if: ? You often feel depressed. ? You have ever been abused or do not feel safe at home. Summary  Adopting a healthy lifestyle and getting preventive care are important in promoting health and wellness.  Follow your health care provider's instructions about healthy diet, exercising, and getting tested or screened for diseases.  Follow your health care provider's instructions on monitoring your cholesterol and blood pressure. This information is not intended to replace advice given to you by your health care provider. Make sure you discuss any questions you have with your health care provider. Document Revised: 07/31/2018 Document Reviewed: 07/31/2018 Elsevier Patient Education  2020 [employer].

## 2019-08-27 NOTE — Telephone Encounter (Signed)
Called patient he wanted to review side effects of Lexapro after he picked up from pharmacy. He has not taken yet. Reviewed possible side effects again with patient and also discussed again the black box warning and when to seek immediate care if personality changes, suicide, homicidal ideations. Patient verbalized understanding of all instructions given and denies any further questions at this time. Call back with any concerns or questions and keep one month follow up.

## 2019-08-28 LAB — COMPREHENSIVE METABOLIC PANEL
ALT: 48 IU/L — ABNORMAL HIGH (ref 0–44)
AST: 28 IU/L (ref 0–40)
Albumin/Globulin Ratio: 2.4 — ABNORMAL HIGH (ref 1.2–2.2)
Albumin: 5.1 g/dL (ref 4.1–5.2)
Alkaline Phosphatase: 149 IU/L — ABNORMAL HIGH (ref 39–117)
BUN/Creatinine Ratio: 7 — ABNORMAL LOW (ref 9–20)
BUN: 8 mg/dL (ref 6–20)
Bilirubin Total: 2.4 mg/dL — ABNORMAL HIGH (ref 0.0–1.2)
CO2: 25 mmol/L (ref 20–29)
Calcium: 9.6 mg/dL (ref 8.7–10.2)
Chloride: 101 mmol/L (ref 96–106)
Creatinine, Ser: 1.11 mg/dL (ref 0.76–1.27)
GFR calc Af Amer: 108 mL/min/{1.73_m2} (ref >59)
GFR calc non Af Amer: 94 mL/min/{1.73_m2} (ref >59)
Globulin, Total: 2.1 g/dL (ref 1.5–4.5)
Glucose: 82 mg/dL (ref 65–99)
Potassium: 4.1 mmol/L (ref 3.5–5.2)
Sodium: 140 mmol/L (ref 134–144)
Total Protein: 7.2 g/dL (ref 6.0–8.5)

## 2019-08-28 LAB — CBC WITH DIFFERENTIAL/PLATELET
Basophils Absolute: 0 10*3/uL (ref 0.0–0.2)
Basos: 0 %
EOS (ABSOLUTE): 0.1 10*3/uL (ref 0.0–0.4)
Eos: 1 %
Hematocrit: 48.1 % (ref 37.5–51.0)
Hemoglobin: 16.3 g/dL (ref 13.0–17.7)
Immature Grans (Abs): 0 10*3/uL (ref 0.0–0.1)
Immature Granulocytes: 0 %
Lymphocytes Absolute: 1.7 10*3/uL (ref 0.7–3.1)
Lymphs: 31 %
MCH: 30 pg (ref 26.6–33.0)
MCHC: 33.9 g/dL (ref 31.5–35.7)
MCV: 89 fL (ref 79–97)
Monocytes Absolute: 0.4 10*3/uL (ref 0.1–0.9)
Monocytes: 7 %
Neutrophils Absolute: 3.3 10*3/uL (ref 1.4–7.0)
Neutrophils: 61 %
Platelets: 187 10*3/uL (ref 150–450)
RBC: 5.43 x10E6/uL (ref 4.14–5.80)
RDW: 12.4 % (ref 11.6–15.4)
WBC: 5.4 10*3/uL (ref 3.4–10.8)

## 2019-08-28 LAB — TSH: TSH: 1.43 u[IU]/mL (ref 0.450–4.500)

## 2019-08-28 LAB — LIPID PANEL W/O CHOL/HDL RATIO
Cholesterol, Total: 217 mg/dL — ABNORMAL HIGH (ref 100–199)
HDL: 27 mg/dL — ABNORMAL LOW (ref >39)
LDL Chol Calc (NIH): 164 mg/dL — ABNORMAL HIGH (ref 0–99)
Triglycerides: 142 mg/dL (ref 0–149)
VLDL Cholesterol Cal: 26 mg/dL (ref 5–40)

## 2019-09-26 NOTE — Progress Notes (Addendum)
Larry Rodriguez: Larry Rodriguez Male    DOB: 1996-07-23   24 y.o.   MRN: [id number] Visit Date: 09/29/2019  Today's Provider: Jairo Ben, FNP   Chief Complaint  Larry Rodriguez presents with  . Anxiety    1 month follow up   Subjective:     Anxiety Presents for follow-up (at last office visit Larry Rodriguez was started on Lexapro 10mg ) visit. Symptoms include malaise (improved) and nervous/anxious behavior. Larry Rodriguez reports no chest pain, compulsions, confusion, decreased concentration, depressed mood, dizziness, dry mouth, excessive worry, feeling of choking, hyperventilation, impotence, insomnia, irritability, muscle tension, nausea, obsessions, palpitations, panic, restlessness, shortness of breath or suicidal ideas. The quality of sleep is fair. Nighttime awakenings: one to two.   Compliance with medications is 76-100%.   Larry Rodriguez reports Larry Rodriguez is feeling well.  Wants a note for an emotional support animal.   Larry Rodriguez  denies any fever, body aches,chills, rash, chest pain, shortness of breath, nausea, vomiting, or diarrhea.   No Known Allergies   Current Outpatient Medications:  .  escitalopram (LEXAPRO) 10 MG tablet, Take 1 tablet (10 mg total) by mouth daily., Disp: 35 tablet, Rfl: 0  Review of Systems  Constitutional: Negative.  Negative for irritability.  HENT: Negative.   Respiratory: Negative.  Negative for shortness of breath.   Cardiovascular: Negative.  Negative for chest pain, palpitations and leg swelling.  Gastrointestinal: Negative.  Negative for nausea.  Endocrine: Negative.   Genitourinary: Negative.  Negative for impotence.  Musculoskeletal: Negative.   Neurological: Negative.  Negative for dizziness.  Psychiatric/Behavioral: Negative for agitation, behavioral problems, confusion, decreased concentration, dysphoric mood, hallucinations, self-injury, sleep disturbance and suicidal ideas. The Larry Rodriguez is nervous/anxious. The Larry Rodriguez does not have insomnia and is not  hyperactive.     Social History   Tobacco Use  . Smoking status: Never Smoker  . Smokeless tobacco: Never Used  Substance Use Topics  . Alcohol use: Yes    Alcohol/week: 2.0 standard drinks    Types: 2 Cans of beer per week      Objective:   BP 120/80   Pulse 90   Temp (!) 96.9 F (36.1 C) (Oral)   Resp 16   Wt 198 lb 3.2 oz (89.9 kg)   SpO2 98%   BMI 24.13 kg/m  Vitals:   09/29/19 0813  BP: 120/80  Pulse: 90  Resp: 16  Temp: (!) 96.9 F (36.1 C)  TempSrc: Oral  SpO2: 98%  Weight: 198 lb 3.2 oz (89.9 kg)  Body mass index is 24.13 kg/m.   Physical Exam Vitals and nursing note reviewed.  Constitutional:      General: Larry Rodriguez is not in acute distress.    Appearance: Normal appearance. Larry Rodriguez is well-developed. Larry Rodriguez is not ill-appearing, toxic-appearing or diaphoretic.     Comments: Larry Rodriguez is alert and oriented and responsive to questions Engages in eye contact with provider. Speaks in full sentences without any pauses without any shortness of breath or distress.   Tall and thin.    HENT:     Head: Normocephalic and atraumatic.     Right Ear: Hearing, tympanic membrane, ear canal and external ear normal.     Left Ear: Hearing, tympanic membrane, ear canal and external ear normal.     Nose: Nose normal.     Mouth/Throat:     Lips: Pink.     Mouth: Mucous membranes are moist.     Palate: No mass.     Pharynx: Oropharynx is clear.  Uvula midline. No oropharyngeal exudate.     Tonsils: 1+ on the right. 1+ on the left.  Eyes:     General: Lids are normal. No scleral icterus.       Right eye: No discharge.        Left eye: No discharge.     Conjunctiva/sclera: Conjunctivae normal.     Pupils: Pupils are equal, round, and reactive to light.  Neck:     Thyroid: No thyromegaly.     Vascular: Normal carotid pulses. No carotid bruit, hepatojugular reflux or JVD.     Trachea: Trachea and phonation normal. No tracheal tenderness or tracheal deviation.     Meningeal:  Brudzinski's sign absent.  Cardiovascular:     Rate and Rhythm: Normal rate and regular rhythm.     Pulses: Normal pulses.     Heart sounds: Normal heart sounds, S1 normal and S2 normal. Heart sounds not distant. No murmur. No friction rub. No gallop.   Pulmonary:     Effort: Pulmonary effort is normal. No accessory muscle usage or respiratory distress.     Breath sounds: Normal breath sounds. No stridor. No wheezing, rhonchi or rales.  Chest:     Chest wall: No tenderness.  Abdominal:     General: Bowel sounds are normal. There is no distension.     Palpations: Abdomen is soft. There is no mass.     Tenderness: There is no abdominal tenderness. There is no guarding or rebound.     Hernia: No hernia is present.  Musculoskeletal:        General: No tenderness or deformity. Normal range of motion.     Cervical back: Full passive range of motion without pain, normal range of motion and neck supple.     Comments: Larry Rodriguez moves on and off of exam table and in room without difficulty. Gait is normal in hall and in room. Larry Rodriguez is oriented to person place time and situation. Larry Rodriguez answers questions appropriately and engages in conversation.   Lymphadenopathy:     Head:     Right side of head: No submental, submandibular, tonsillar, preauricular, posterior auricular or occipital adenopathy.     Left side of head: No submental, submandibular, tonsillar, preauricular, posterior auricular or occipital adenopathy.     Cervical: No cervical adenopathy.  Skin:    General: Skin is warm and dry.     Capillary Refill: Capillary refill takes less than 2 seconds.     Coloration: Skin is not pale.     Findings: No erythema or rash.     Nails: There is no clubbing.  Neurological:     General: No focal deficit present.     Mental Status: Larry Rodriguez is alert and oriented to person, place, and time.     GCS: GCS eye subscore is 4. GCS verbal subscore is 5. GCS motor subscore is 6.     Cranial Nerves: No  cranial nerve deficit.     Sensory: No sensory deficit.     Motor: No weakness or abnormal muscle tone.     Coordination: Coordination normal.     Gait: Gait normal.     Deep Tendon Reflexes: Reflexes are normal and symmetric. Reflexes normal.  Psychiatric:        Mood and Affect: Mood normal.        Speech: Speech normal.        Behavior: Behavior normal.        Thought Content: Thought content normal.  Judgment: Judgment normal.      No results found for any visits on 09/29/19.     Assessment & Plan    1. Elevated alkaline phosphatase level Will recheck CMP today Larry Rodriguez is fasting. Mildly elevated AST   2. Anxiety Doing well with Lexapro. No unwanted side effects. Larry Rodriguez will continue current dose.  Follow up in 3 months. Larry Rodriguez is still aware and reeducated on side effects and black box warning of Lexapro.   Meds ordered this encounter  Medications  . DISCONTD: escitalopram (LEXAPRO) 10 MG tablet    Sig: Take 1 tablet (10 mg total) by mouth daily.    Dispense:  90 tablet    Refill:  0  . escitalopram (LEXAPRO) 10 MG tablet    Sig: Take 1 tablet (10 mg total) by mouth daily.    Dispense:  90 tablet    Refill:  0    3. Need for emotional support Note given for emotional support animal. Also given counseling resources Larry Rodriguez can reach out to.  4. Marfan syndrome Discussed need for follow up with Orthopedics, cardiology at the least given his history. Larry Rodriguez reports his specialist in the past told him that Larry Rodriguez only needed to follow up if symptoms presented. Larry Rodriguez has no concerns ast this time and declined all recommended refferals today. Larry Rodriguez is aware Larry Rodriguez can call anytime during office hours and I will start referral. Larry Rodriguez is also aware of RED FLAGS and when to call the office or seek care immediately.    RECONSTRUCTION DISLOCATING PATELLA 09/16/2013 Knee/Left Procedure: RECONSTRUCTION DISLOCATING PATELLA; Surgeon: Amador Cunas, MD; Location: [facility] OR; Service: Orthopedics; Laterality:  Left;  Medical devices from this surgery are in the Implants section.   TUBERCLEPLASTY ANTERIOR TIBIA 09/16/2013 Knee/Left Procedure: TUBERCLEPLASTY ANTERIOR TIBIA; Surgeon: Amador Cunas, MD; Location: [facility] OR; Service: Orthopedics; Laterality: Left;  Medical devices from this surgery are in the Implants section.   ARTHROSCOPY KNEE 09/16/2013 Knee/Left Knee/Left Knee/Left Procedure: KNEE SCOPE,PART SYNOVECT; Surgeon: Amador Cunas, MD; Location: [facility] OR; Service: Orthopedics; Laterality: Left;  Medical devices from this surgery are in the Implants section.    Implanted Type Area Manufacturer Device Identifier Shelf Expiration Date Model / Serial / Lot  Allograft, Tendon Semi-Tendinosus >258mm - [id number] Implanted: Qty: 1 on 09/16/2013 by Amador Cunas, MD at [facility]  Left: [place]  07/02/2016 [id number] / / [id number]  Screw, [employer]is Biocomposite 6.25x2mm - Sn/A Implanted: Qty: 1 on 09/16/2013 by Amador Cunas, MD at [facility]  Left: Knee [place]  02/17/2014 [id number] / N/A / [id number]  Screw, [employer] Biocomposite 8.0x36mm - Sn/A Implanted: Qty: 1 on 09/16/2013 by Amador Cunas, MD at [facility]  Left: Knee [place]  04/21/2015 [id number] / N/A / [id number]  Screw, Cortex Slf-Tp W/3.5 Hex 4.5x60mm - Sn/A Implanted: Qty: 1 on 09/16/2013 by Amador Cunas, MD at [facility]  Left: Knee SYNTHES   214.842 / N/A / N/A  Screw, Cortex Slf-Tp W/3.5 Hex 4.5x39mm - Sn/A Implanted: Qty: 1 on 09/16/2013 by Amador Cunas, MD at [facility]                       [place]  [place], [place] [address]                     Phone [phone]                      Fax  [phone]                Pediatric Echo Transthoracic Report  Name: IMAD, SHOSTAK Date: 03/07/2010 10:04 AM MRN: [id number]         Larry Rodriguez Location: Pediatric Echo Lab     Height: 170 cm DOB: [birth year]       Gender: Male                 Weight: 41 kg Age: 61 yrs                                      BSA: 1.4 meters2 Reason For Study: 759.82 MARFAN SYNDROME Performed By: MD Kyla Balzarine  INTERPRETATION SUMMARY  No cardiac disease identified.   Advised Larry Rodriguez call the office or your primary care doctor for an appointment if no improvement within 72 hours or if any symptoms change or worsen at any time  Advised ER or urgent Care if after hours or on weekend. Call 911 for emergency symptoms at any time.Patinet verbalized understanding of all instructions given/reviewed and treatment plan and has no further questions or concerns at this time.       The entirety of the information documented in the History of Present Illness, Review of Systems and Physical Exam were personally obtained by me. Portions of this information were initially documented by the  Certified Medical Assistant whose name is documented in Epic and reviewed by me for thoroughness and accuracy.  I have personally performed the exam and reviewed the chart and it is accurate to the best of my knowledge.  Museum/gallery conservator has been used and any errors in dictation or transcription are unintentional.  Eula Fried. Flinchum FNP-C  [facility]     Larry Ben, FNP  [facility]

## 2019-09-29 ENCOUNTER — Encounter: Payer: Self-pay | Admitting: Adult Health

## 2019-09-29 ENCOUNTER — Other Ambulatory Visit: Payer: Self-pay

## 2019-09-29 ENCOUNTER — Ambulatory Visit: Payer: Managed Care, Other (non HMO) | Admitting: Adult Health

## 2019-09-29 VITALS — BP 120/80 | HR 90 | Temp 96.9°F | Resp 16 | Wt 198.2 lb

## 2019-09-29 DIAGNOSIS — R4589 Other symptoms and signs involving emotional state: Secondary | ICD-10-CM

## 2019-09-29 DIAGNOSIS — R748 Abnormal levels of other serum enzymes: Secondary | ICD-10-CM

## 2019-09-29 DIAGNOSIS — F419 Anxiety disorder, unspecified: Secondary | ICD-10-CM | POA: Diagnosis not present

## 2019-09-29 DIAGNOSIS — Q8740 Marfan's syndrome, unspecified: Secondary | ICD-10-CM | POA: Diagnosis not present

## 2019-09-29 DIAGNOSIS — Z967 Presence of other bone and tendon implants: Secondary | ICD-10-CM

## 2019-09-29 MED ORDER — ESCITALOPRAM OXALATE 10 MG PO TABS: 10.0000 mg | ORAL_TABLET | Freq: Every day | ORAL | 0 refills | Status: DC

## 2019-09-29 NOTE — Patient Instructions (Signed)
Escitalopram tablets What is this medicine? ESCITALOPRAM (es sye TAL oh pram) is used to treat depression and certain types of anxiety. This medicine may be used for other purposes; ask your health care provider or pharmacist if you have questions. COMMON BRAND NAME(S): Lexapro What should I tell my health care provider before I take this medicine? They need to know if you have any of these conditions:  bipolar disorder or a family history of bipolar disorder  diabetes  glaucoma  heart disease  kidney or liver disease  receiving electroconvulsive therapy  seizures (convulsions)  suicidal thoughts, plans, or attempt by you or a family member  an unusual or allergic reaction to escitalopram, the related drug citalopram, other medicines, foods, dyes, or preservatives  pregnant or trying to become pregnant  breast-feeding How should I use this medicine? Take this medicine by mouth with a glass of water. Follow the directions on the prescription label. You can take it with or without food. If it upsets your stomach, take it with food. Take your medicine at regular intervals. Do not take it more often than directed. Do not stop taking this medicine suddenly except upon the advice of your doctor. Stopping this medicine too quickly may cause serious side effects or your condition may worsen. A special MedGuide will be given to you by the pharmacist with each prescription and refill. Be sure to read this information carefully each time. Talk to your pediatrician regarding the use of this medicine in children. Special care may be needed. Overdosage: If you think you have taken too much of this medicine contact a poison control center or emergency room at once. NOTE: This medicine is only for you. Do not share this medicine with others. What if I miss a dose? If you miss a dose, take it as soon as you can. If it is almost time for your next dose, take only that dose. Do not take double or  extra doses. What may interact with this medicine? Do not take this medicine with any of the following medications:  certain medicines for fungal infections like fluconazole, itraconazole, ketoconazole, posaconazole, voriconazole  cisapride  citalopram  dronedarone  linezolid  MAOIs like Carbex, Eldepryl, Marplan, Nardil, and Parnate  methylene blue (injected into a vein)  pimozide  thioridazine This medicine may also interact with the following medications:  alcohol  amphetamines  aspirin and aspirin-like medicines  carbamazepine  certain medicines for depression, anxiety, or psychotic disturbances  certain medicines for migraine headache like almotriptan, eletriptan, frovatriptan, naratriptan, rizatriptan, sumatriptan, zolmitriptan  certain medicines for sleep  certain medicines that treat or prevent blood clots like warfarin, enoxaparin, dalteparin  cimetidine  diuretics  dofetilide  fentanyl  furazolidone  isoniazid  lithium  metoprolol  NSAIDs, medicines for pain and inflammation, like ibuprofen or naproxen  other medicines that prolong the QT interval (cause an abnormal heart rhythm)  procarbazine  rasagiline  supplements like St. John's wort, kava kava, valerian  tramadol  tryptophan  ziprasidone This list may not describe all possible interactions. Give your health care provider a list of all the medicines, herbs, non-prescription drugs, or dietary supplements you use. Also tell them if you smoke, drink alcohol, or use illegal drugs. Some items may interact with your medicine. What should I watch for while using this medicine? Tell your doctor if your symptoms do not get better or if they get worse. Visit your doctor or health care professional for regular checks on your progress. Because it may   take several weeks to see the full effects of this medicine, it is important to continue your treatment as prescribed by your doctor. Patients  and their families should watch out for new or worsening thoughts of suicide or depression. Also watch out for sudden changes in feelings such as feeling anxious, agitated, panicky, irritable, hostile, aggressive, impulsive, severely restless, overly excited and hyperactive, or not being able to sleep. If this happens, especially at the beginning of treatment or after a change in dose, call your health care professional. Bonita Quin may get drowsy or dizzy. Do not drive, use machinery, or do anything that needs mental alertness until you know how this medicine affects you. Do not stand or sit up quickly, especially if you are an older patient. This reduces the risk of dizzy or fainting spells. Alcohol may interfere with the effect of this medicine. Avoid alcoholic drinks. Your mouth may get dry. Chewing sugarless gum or sucking hard candy, and drinking plenty of water may help. Contact your doctor if the problem does not go away or is severe. What side effects may I notice from receiving this medicine? Side effects that you should report to your doctor or health care professional as soon as possible:  allergic reactions like skin rash, itching or hives, swelling of the face, lips, or tongue  anxious  black, tarry stools  changes in vision  confusion  elevated mood, decreased need for sleep, racing thoughts, impulsive behavior  eye pain  fast, irregular heartbeat  feeling faint or lightheaded, falls  feeling agitated, angry, or irritable  hallucination, loss of contact with reality  loss of balance or coordination  loss of memory  painful or prolonged erections  restlessness, pacing, inability to keep still  seizures  stiff muscles  suicidal thoughts or other mood changes  trouble sleeping  unusual bleeding or bruising  unusually weak or tired  vomiting Side effects that usually do not require medical attention (report to your doctor or health care professional if they  continue or are bothersome):  changes in appetite  change in sex drive or performance  headache  increased sweating  indigestion, nausea  tremors This list may not describe all possible side effects. Call your doctor for medical advice about side effects. You may report side effects to FDA at 1-800-FDA-1088. Where should I keep my medicine? Keep out of reach of children. Store at room temperature between 15 and 30 degrees C (59 and 86 degrees F). Throw away any unused medicine after the expiration date. NOTE: This sheet is a summary. It may not cover all possible information. If you have questions about this medicine, talk to your doctor, pharmacist, or health care provider.  2020 Elsevier/Gold Standard (2018-07-29 11:21:44) Generalized Anxiety Disorder, Adult Generalized anxiety disorder (GAD) is a mental health disorder. People with this condition constantly worry about everyday events. Unlike normal anxiety, worry related to GAD is not triggered by a specific event. These worries also do not fade or get better with time. GAD interferes with life functions, including relationships, work, and school. GAD can vary from mild to severe. People with severe GAD can have intense waves of anxiety with physical symptoms (panic attacks). What are the causes? The exact cause of GAD is not known. What increases the risk? This condition is more likely to develop in:  Women.  People who have a family history of anxiety disorders.  People who are very shy.  People who experience very stressful life events, such as the death  of a loved one.  People who have a very stressful family environment. What are the signs or symptoms? People with GAD often worry excessively about many things in their lives, such as their health and family. They may also be overly concerned about:  Doing well at work.  Being on time.  Natural disasters.  Friendships. Physical symptoms of GAD  include:  Fatigue.  Muscle tension or having muscle twitches.  Trembling or feeling shaky.  Being easily startled.  Feeling like your heart is pounding or racing.  Feeling out of breath or like you cannot take a deep breath.  Having trouble falling asleep or staying asleep.  Sweating.  Nausea, diarrhea, or irritable bowel syndrome (IBS).  Headaches.  Trouble concentrating or remembering facts.  Restlessness.  Irritability. How is this diagnosed? Your health care provider can diagnose GAD based on your symptoms and medical history. You will also have a physical exam. The health care provider will ask specific questions about your symptoms, including how severe they are, when they started, and if they come and go. Your health care provider may ask you about your use of alcohol or drugs, including prescription medicines. Your health care provider may refer you to a mental health specialist for further evaluation. Your health care provider will do a thorough examination and may perform additional tests to rule out other possible causes of your symptoms. To be diagnosed with GAD, a person must have anxiety that:  Is out of his or her control.  Affects several different aspects of his or her life, such as work and relationships.  Causes distress that makes him or her unable to take part in normal activities.  Includes at least three physical symptoms of GAD, such as restlessness, fatigue, trouble concentrating, irritability, muscle tension, or sleep problems. Before your health care provider can confirm a diagnosis of GAD, these symptoms must be present more days than they are not, and they must last for six months or longer. How is this treated? The following therapies are usually used to treat GAD:  Medicine. Antidepressant medicine is usually prescribed for long-term daily control. Antianxiety medicines may be added in severe cases, especially when panic attacks occur.  Talk  therapy (psychotherapy). Certain types of talk therapy can be helpful in treating GAD by providing support, education, and guidance. Options include: ? Cognitive behavioral therapy (CBT). People learn coping skills and techniques to ease their anxiety. They learn to identify unrealistic or negative thoughts and behaviors and to replace them with positive ones. ? Acceptance and commitment therapy (ACT). This treatment teaches people how to be mindful as a way to cope with unwanted thoughts and feelings. ? Biofeedback. This process trains you to manage your body's response (physiological response) through breathing techniques and relaxation methods. You will work with a therapist while machines are used to monitor your physical symptoms.  Stress management techniques. These include yoga, meditation, and exercise. A mental health specialist can help determine which treatment is best for you. Some people see improvement with one type of therapy. However, other people require a combination of therapies. Follow these instructions at home:  Take over-the-counter and prescription medicines only as told by your health care provider.  Try to maintain a normal routine.  Try to anticipate stressful situations and allow extra time to manage them.  Practice any stress management or self-calming techniques as taught by your health care provider.  Do not punish yourself for setbacks or for not making progress.  Try to  recognize your accomplishments, even if they are small.  Keep all follow-up visits as told by your health care provider. This is important. Contact a health care provider if:  Your symptoms do not get better.  Your symptoms get worse.  You have signs of depression, such as: ? A persistently sad, cranky, or irritable mood. ? Loss of enjoyment in activities that used to bring you joy. ? Change in weight or eating. ? Changes in sleeping habits. ? Avoiding friends or family  members. ? Loss of energy for normal tasks. ? Feelings of guilt or worthlessness. Get help right away if:  You have serious thoughts about hurting yourself or others. If you ever feel like you may hurt yourself or others, or have thoughts about taking your own life, get help right away. You can go to your nearest emergency department or call:  Your local emergency services (911 in the [place]  A suicide crisis helpline, such as the National Suicide Prevention Lifeline at 4057089636. This is open 24 hours a day. Summary  Generalized anxiety disorder (GAD) is a mental health disorder that involves worry that is not triggered by a specific event.  People with GAD often worry excessively about many things in their lives, such as their health and family.  GAD may cause physical symptoms such as restlessness, trouble concentrating, sleep problems, frequent sweating, nausea, diarrhea, headaches, and trembling or muscle twitching.  A mental health specialist can help determine which treatment is best for you. Some people see improvement with one type of therapy. However, other people require a combination of therapies. This information is not intended to replace advice given to you by your health care provider. Make sure you discuss any questions you have with your health care provider. Document Revised: 07/20/2017 Document Reviewed: 06/27/2016 Elsevier Patient Education  2020 [employer].

## 2019-09-30 ENCOUNTER — Telehealth: Payer: Self-pay

## 2019-09-30 LAB — COMPREHENSIVE METABOLIC PANEL
ALT: 51 IU/L — ABNORMAL HIGH (ref 0–44)
AST: 33 IU/L (ref 0–40)
Albumin/Globulin Ratio: 2.5 — ABNORMAL HIGH (ref 1.2–2.2)
Albumin: 4.9 g/dL (ref 4.1–5.2)
Alkaline Phosphatase: 142 IU/L — ABNORMAL HIGH (ref 39–117)
BUN/Creatinine Ratio: 7 — ABNORMAL LOW (ref 9–20)
BUN: 7 mg/dL (ref 6–20)
Bilirubin Total: 1.2 mg/dL (ref 0.0–1.2)
CO2: 22 mmol/L (ref 20–29)
Calcium: 9.5 mg/dL (ref 8.7–10.2)
Chloride: 101 mmol/L (ref 96–106)
Creatinine, Ser: 1.04 mg/dL (ref 0.76–1.27)
GFR calc Af Amer: 117 mL/min/{1.73_m2} (ref >59)
GFR calc non Af Amer: 101 mL/min/{1.73_m2} (ref >59)
Globulin, Total: 2 g/dL (ref 1.5–4.5)
Glucose: 91 mg/dL (ref 65–99)
Potassium: 4 mmol/L (ref 3.5–5.2)
Sodium: 141 mmol/L (ref 134–144)
Total Protein: 6.9 g/dL (ref 6.0–8.5)

## 2019-09-30 NOTE — Telephone Encounter (Signed)
-----   Message from Berniece Pap, FNP sent at 09/30/2019  1:40 PM EST ----- ALT and alkaline phosphatase is still mildly  elevated. Bilirubin has returned to normal.  Would like repeat fasting CMP/CBC in three months on or around 12/20/2019. Avoid alcohol and tylenol. Report any new symptoms.   Advise patient call the office or your primary care doctor for an appointment if no improvement within 72 hours or if any symptoms change or worsen at any time  Advised ER or urgent Care if after hours or on weekend. Call 911 for emergency symptoms at any time.Patinet verbalized understanding of all instructions given/reviewed and treatment plan and has no further questions or concerns at this time.

## 2019-09-30 NOTE — Telephone Encounter (Signed)
Patient advised as below. Patient verbalizes understanding and is in agreement with treatment plan.  

## 2019-09-30 NOTE — Progress Notes (Signed)
ALT and alkaline phosphatase is still mildly  elevated. Bilirubin has returned to normal.  Would like repeat fasting CMP/CBC in three months on or around 12/20/2019. Avoid alcohol and tylenol. Report any new symptoms.   Advise patient call the office or your primary care doctor for an appointment if no improvement within 72 hours or if any symptoms change or worsen at any time  Advised ER or urgent Care if after hours or on weekend. Call 911 for emergency symptoms at any time.Patinet verbalized understanding of all instructions given/reviewed and treatment plan and has no further questions or concerns at this time.

## 2019-12-29 ENCOUNTER — Other Ambulatory Visit: Payer: Self-pay

## 2019-12-29 ENCOUNTER — Ambulatory Visit: Payer: Managed Care, Other (non HMO) | Admitting: Adult Health

## 2019-12-29 ENCOUNTER — Encounter: Payer: Self-pay | Admitting: Adult Health

## 2019-12-29 VITALS — BP 128/80 | HR 92 | Temp 96.8°F | Resp 16 | Wt 203.4 lb

## 2019-12-29 DIAGNOSIS — R748 Abnormal levels of other serum enzymes: Secondary | ICD-10-CM

## 2019-12-29 DIAGNOSIS — F419 Anxiety disorder, unspecified: Secondary | ICD-10-CM | POA: Diagnosis not present

## 2019-12-29 MED ORDER — ESCITALOPRAM OXALATE 10 MG PO TABS: 10.0000 mg | ORAL_TABLET | Freq: Every day | ORAL | 1 refills | Status: DC

## 2019-12-29 NOTE — Patient Instructions (Signed)

## 2019-12-29 NOTE — Progress Notes (Signed)
Established patient visit   Patient: Larry Rodriguez   DOB: 10/01/1995   24 y.o. Male  MRN: [id number] Visit Date: 12/29/2019  Today's healthcare provider: Marcille Buffy, FNP   Chief Complaint  Patient presents with  . Anxiety    Follow Up   Subjective    HPI Anxiety, Follow-up  He was last seen for anxiety 2 months ago. Changes made at last visit include none.   He reports excellent compliance with treatment. He reports excellent tolerance of treatment. He is not having side effects. Denies any suicidal or  Homicidal ideations or intents.   He feels his anxiety is mild and improving since last visit.  Symptoms: No chest pain No difficulty concentrating No dizziness No fatigue No feelings of losing control No insomnia Yes irritable No palpitations No panic attacks No racing thoughts No shortness of breath Yes sweating No tremors/shakes  Sweating is normal for him, he has sweating all his life no change.    He reports he feels well. He has history of elevated liver enzymes,he has since last visit cut out alcohol and also limits tylenol.  He has Marfan's characteristics which has been discussed.    He does report he will be moving to [place] the end of July. He is relocating to be near wife;'s family as she is in school to become a Product manager and he considering school at [employer] [place] to become a Pharmacist, hospital possibly.  Denies dizziness, lightheaded, pre syncopal or syncopal episodes.  Patient  denies any fever, body aches,chills, rash, chest pain, shortness of breath, nausea, vomiting, or diarrhea.   GAD-7 Results GAD-7 Generalized Anxiety Disorder Screening Tool 08/27/2019  1. Feeling Nervous, Anxious, or on Edge 2  2. Not Being Able to Stop or Control Worrying 1  3. Worrying Too Much About Different Things 2  4. Trouble Relaxing 1  6. Becoming Easily Annoyed or Irritable 2  7. Feeling Afraid As If Something Awful Might Happen 1      PHQ-9 Scores PHQ9 SCORE ONLY 08/27/2019  Score 6    ---------------------------------------------------------------------------------------------------  Patient Active Problem List   Diagnosis Date Noted  . Metal bone fixation hardware in place 09/29/2019  . Need for emotional support 09/29/2019  . Anxiety 08/27/2019  . Routine health maintenance 08/27/2019  . Pain in shin, right 10/16/2014  . 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 Anxiety    No Known Allergies     Medications: Outpatient Medications Prior to Visit  Medication Sig  . [DISCONTINUED] escitalopram (LEXAPRO) 10 MG tablet Take 1 tablet (10 mg total) by mouth daily.   No facility-administered medications prior to visit.    Review of Systems  Constitutional: Negative for activity change, appetite change, chills, diaphoresis, fatigue, fever and unexpected weight change.  HENT: Negative.   Eyes: Negative for photophobia, redness and visual disturbance.  Respiratory: Negative.   Cardiovascular: Negative.   Gastrointestinal: Negative.   Endocrine: Negative for polydipsia, polyphagia and polyuria.  Genitourinary: Negative.   Musculoskeletal: Negative.   Skin: Negative.   Neurological: Negative.   Psychiatric/Behavioral: Negative for agitation, behavioral problems, confusion, decreased concentration, dysphoric mood, hallucinations, self-injury, sleep disturbance and suicidal ideas. The patient is nervous/anxious (improving with medication. ). The patient is not hyperactive.     Last CBC Lab Results  Component Value Date   WBC 5.4 08/27/2019   HGB 16.3 08/27/2019  HCT 48.1 08/27/2019   MCV 89 08/27/2019   MCH 30.0 08/27/2019   RDW 12.4 08/27/2019   PLT 187 08/27/2019   Last metabolic panel Lab Results  Component Value Date   GLUCOSE 91 09/29/2019   NA 141 09/29/2019   K 4.0 09/29/2019   CL 101 09/29/2019   CO2  22 09/29/2019   BUN 7 09/29/2019   CREATININE 1.04 09/29/2019   GFRNONAA 101 09/29/2019   GFRAA 117 09/29/2019   CALCIUM 9.5 09/29/2019   PROT 6.9 09/29/2019   ALBUMIN 4.9 09/29/2019   LABGLOB 2.0 09/29/2019   AGRATIO 2.5 (H) 09/29/2019   BILITOT 1.2 09/29/2019   ALKPHOS 142 (H) 09/29/2019   AST 33 09/29/2019   ALT 51 (H) 09/29/2019   Last lipids Lab Results  Component Value Date   CHOL 217 (H) 08/27/2019   HDL 27 (L) 08/27/2019   LDLCALC 164 (H) 08/27/2019   TRIG 142 08/27/2019   Last hemoglobin A1c No results found for: HGBA1C Last thyroid functions Lab Results  Component Value Date   TSH 1.430 08/27/2019   Last vitamin D No results found for: 25OHVITD2, 25OHVITD3, VD25OH Last vitamin B12 and Folate No results found for: VITAMINB12, FOLATE    Objective    BP 128/80   Pulse 92   Temp (!) 96.8 F (36 C) (Oral)   Resp 16   Wt 203 lb 6.4 oz (92.3 kg)   SpO2 95%   BMI 24.76 kg/m  BP Readings from Last 3 Encounters:  12/29/19 128/80  09/29/19 120/80  08/27/19 136/88    temperature above was taken temporal not oral due to office protocol with Covid 19.     Physical Exam  Constitutional: He is oriented to person, place, and time and well-developed, well-nourished, and in no distress. No distress.  HENT:  Head: Normocephalic.  Right Ear: External ear normal.  Left Ear: External ear normal.  Eyes: Pupils are equal, round, and reactive to light. Conjunctivae and EOM are normal. Right eye exhibits no discharge. Left eye exhibits no discharge. No scleral icterus.  Neck: No tracheal deviation present.  Cardiovascular: Normal rate and regular rhythm. Exam reveals no gallop and no friction rub.  No murmur heard. Pulmonary/Chest: Effort normal.  Abdominal: Soft. Bowel sounds are normal. He exhibits no distension. There is no abdominal tenderness.  Musculoskeletal:        General: Normal range of motion.     Cervical back: Normal range of motion and neck supple.    Neurological: He is alert and oriented to person, place, and time. No cranial nerve deficit. GCS score is 15.  Skin: Skin is warm and dry. No rash noted. He is not diaphoretic. No erythema. No pallor.  Psychiatric: Mood, memory, affect and judgment normal.    No results found for any visits on 12/29/19.  Assessment & Plan     Elevated alkaline phosphatase level - Plan: CBC with Differential/Platelet, Comprehensive metabolic panel  Anxiety Continue medication and follow back in office in 6 months or sooner if needed.  Return in about 6 months (around 06/30/2020), or anxiety, for make sure to establish care with primary doctor when you move to [place]. .     Meds ordered this encounter  Medications  . escitalopram (LEXAPRO) 10 MG tablet    Sig: Take 1 tablet (10 mg total) by mouth daily.    Dispense:  90 tablet    Refill:  1  he is aware of black box warning of above drug  as well as signs of serotonin syndrome. He is able to seek care immediately should any occur.   Orders Placed This Encounter  Procedures  . CBC with Differential/Platelet  . Comprehensive metabolic panel  recheck liver enzymes.  establish primary care in [place] once moved.  Advised patient call the office or your primary care doctor for an appointment if no improvement within 72 hours or if any symptoms change or worsen at any time  Advised ER or urgent Care if after hours or on weekend. Call 911 for emergency symptoms at any time.Patinet verbalized understanding of all instructions given/reviewed and treatment plan and has no further questions or concerns at this time.     Return in about 6 months (around 06/30/2020), or anxiety, for make sure to establish care with primary doctor when you move to [place]. seek care immediately if need.Susa Griffins Tiarrah Saville, FNP, have reviewed all documentation for this visit. The documentation on 12/29/19 for the exam, diagnosis, procedures, and orders are all accurate and  complete.   Addressed extensive list of chronic and acute medical problems today requiring 32 minutes reviewing his medical record, counseling patient regarding his conditions and coordination of care.    Jairo Ben, FNP  [facility] [phone] (phone) [phone] (fax)  [facility]

## 2019-12-30 LAB — COMPREHENSIVE METABOLIC PANEL
ALT: 30 IU/L (ref 0–44)
AST: 26 IU/L (ref 0–40)
Albumin/Globulin Ratio: 1.9 (ref 1.2–2.2)
Albumin: 4.5 g/dL (ref 4.1–5.2)
Alkaline Phosphatase: 127 IU/L — ABNORMAL HIGH (ref 39–117)
BUN/Creatinine Ratio: 13 (ref 9–20)
BUN: 13 mg/dL (ref 6–20)
Bilirubin Total: 1.1 mg/dL (ref 0.0–1.2)
CO2: 23 mmol/L (ref 20–29)
Calcium: 9.5 mg/dL (ref 8.7–10.2)
Chloride: 104 mmol/L (ref 96–106)
Creatinine, Ser: 1 mg/dL (ref 0.76–1.27)
GFR calc Af Amer: 123 mL/min/{1.73_m2} (ref >59)
GFR calc non Af Amer: 106 mL/min/{1.73_m2} (ref >59)
Globulin, Total: 2.4 g/dL (ref 1.5–4.5)
Glucose: 93 mg/dL (ref 65–99)
Potassium: 3.9 mmol/L (ref 3.5–5.2)
Sodium: 141 mmol/L (ref 134–144)
Total Protein: 6.9 g/dL (ref 6.0–8.5)

## 2019-12-30 LAB — CBC WITH DIFFERENTIAL/PLATELET
Basophils Absolute: 0 10*3/uL (ref 0.0–0.2)
Basos: 1 %
EOS (ABSOLUTE): 0.1 10*3/uL (ref 0.0–0.4)
Eos: 2 %
Hematocrit: 45.9 % (ref 37.5–51.0)
Hemoglobin: 15.1 g/dL (ref 13.0–17.7)
Immature Grans (Abs): 0 10*3/uL (ref 0.0–0.1)
Immature Granulocytes: 0 %
Lymphocytes Absolute: 1.7 10*3/uL (ref 0.7–3.1)
Lymphs: 36 %
MCH: 29.8 pg (ref 26.6–33.0)
MCHC: 32.9 g/dL (ref 31.5–35.7)
MCV: 91 fL (ref 79–97)
Monocytes Absolute: 0.4 10*3/uL (ref 0.1–0.9)
Monocytes: 7 %
Neutrophils Absolute: 2.6 10*3/uL (ref 1.4–7.0)
Neutrophils: 54 %
Platelets: 150 10*3/uL (ref 150–450)
RBC: 5.06 x10E6/uL (ref 4.14–5.80)
RDW: 12.9 % (ref 11.6–15.4)
WBC: 4.8 10*3/uL (ref 3.4–10.8)

## 2019-12-30 NOTE — Progress Notes (Signed)
Add on GGT, and Nucleotidase 5' blood to labs for diagnosis of elevated alk phosphatase.

## 2020-01-01 ENCOUNTER — Telehealth: Payer: Self-pay

## 2020-01-01 NOTE — Progress Notes (Signed)
Alkaline phos improved from last was 142, now 127 still mild increase. Continue to not drink alcohol as ALT is improved down from 51 to 30 and in normal range.  CBC within normal limits.  GGT is within normal limits.  Do recommend once moved and established with new PCP having this lab rechecked and discussing follow up work up for Marfans given past history and no follow up in recent years.

## 2020-01-01 NOTE — Telephone Encounter (Signed)
-----   Message from Berniece Pap, FNP sent at 01/01/2020  2:26 PM EDT ----- Alkaline phos improved from last was 142, now 127 still mild increase. Continue to not drink alcohol as ALT is improved down from 51 to 30 and in normal range.  CBC within normal limits.  GGT is within normal limits.  Do recommend once moved and established with new PCP having this lab rechecked and discussing follow up work up for Marfans given past history and no follow up in recent years.

## 2020-01-01 NOTE — Telephone Encounter (Signed)
lmtcb okay for PEC triage to advise patient of results. KW 

## 2020-01-02 LAB — GAMMA GT: GGT: 28 IU/L (ref 0–65)

## 2020-01-02 LAB — NUCLEOTIDASE, 5', BLOOD: 5-Nucleotidase: 4 IU/L (ref 0–10)

## 2020-01-02 LAB — SPECIMEN STATUS REPORT

## 2020-01-02 NOTE — Progress Notes (Signed)
GGT and 5 Nucleotidase within normal - so keep recommendations as previously discussed for follow up and any needed work up for Marfans syndrome.

## 2020-01-02 NOTE — Telephone Encounter (Signed)
Phone call to pt.  Advised of result note per PCP.  Pt. Verb. Understanding.  Denied any questions at this time.

## 2020-07-04 ENCOUNTER — Other Ambulatory Visit: Payer: Self-pay | Admitting: Adult Health

## 2020-07-04 NOTE — Telephone Encounter (Signed)
Requested Prescriptions  Pending Prescriptions Disp Refills  . escitalopram (LEXAPRO) 10 MG tablet [Pharmacy Med Name: ESCITALOPRAM 10MG  TABLETS] 30 tablet 0    Sig: TAKE 1 TABLET(10 MG) BY MOUTH DAILY     Psychiatry:  Antidepressants - SSRI Failed - 07/04/2020  4:07 AM      Failed - Valid encounter within last 6 months    Recent Outpatient Visits          6 months ago Elevated alkaline phosphatase level   [facility] Fl[facility], [facility], FNP   9 months ago Anxiety   [facility], [facility], FNP   10 months ago Routine health maintenance   [facility] Fl[facility]um, [facility], FNP             One month courtesy refill with reminder for patient to call and schedule an appointment for an office visit.

## 2020-07-07 ENCOUNTER — Other Ambulatory Visit: Payer: Self-pay | Admitting: Adult Health

## 2020-07-07 MED ORDER — ESCITALOPRAM OXALATE 10 MG PO TABS: 10.0000 mg | ORAL_TABLET | Freq: Every day | ORAL | 0 refills | Status: DC

## 2020-07-07 NOTE — Telephone Encounter (Signed)
Called patient to schedule appt. No answer, left voicemail to call clinic back to schedule appt.

## 2020-08-04 ENCOUNTER — Other Ambulatory Visit: Payer: Self-pay | Admitting: Adult Health

## 2020-09-13 ENCOUNTER — Encounter: Payer: Self-pay | Admitting: Adult Health

## 2020-12-06 ENCOUNTER — Other Ambulatory Visit: Payer: Self-pay | Admitting: Adult Health

## 2020-12-06 NOTE — Telephone Encounter (Signed)
Requested medication (s) are due for refill today: no  Requested medication (s) are on the active medication list: yes  Last refill: 09/01/2020  Future visit scheduled: no  Notes to clinic:  overdue for follow up appointment Message has been sent for patient to contact office    Requested Prescriptions  Pending Prescriptions Disp Refills   escitalopram (LEXAPRO) 10 MG tablet [Pharmacy Med Name: ESCITALOPRAM 10MG  TABLETS] 90 tablet     Sig: TAKE 1 TABLET(10 MG) BY MOUTH DAILY      Psychiatry:  Antidepressants - SSRI Failed - 12/06/2020  4:06 AM      Failed - Valid encounter within last 6 months    Recent Outpatient Visits           11 months ago Elevated alkaline phosphatase level   [facility], 12/08/2020, FNP   1 year ago Anxiety   [facility] Flinchum, Eula Fried, FNP   1 year ago Routine health maintenance   [facility] Fl[facility]um, [facility], FNP

## 2020-12-06 NOTE — Telephone Encounter (Signed)
Office visit for follow up when due advised.

## 2021-04-13 ENCOUNTER — Telehealth: Payer: Self-pay

## 2021-04-13 MED ORDER — ESCITALOPRAM OXALATE 10 MG PO TABS: 10.0000 mg | ORAL_TABLET | Freq: Every day | ORAL | 0 refills | Status: AC

## 2021-04-13 NOTE — Telephone Encounter (Signed)
[facility] faxed refill request for the following medications:  escitalopram (LEXAPRO) 10 MG tablet   Please advise.  

## 2021-05-16 NOTE — Telephone Encounter (Signed)
This medication was supposed to be sent to [facility] in [place] which was indicated on previous message.  Please send to the correct pharmacy.

## 2021-05-16 NOTE — Addendum Note (Signed)
Addended by: Kavin Leech E on: 05/16/2021 11:47 AM   Modules accepted: Orders

## 2021-06-20 ENCOUNTER — Telehealth: Payer: Self-pay | Admitting: Adult Health

## 2021-06-20 NOTE — Telephone Encounter (Signed)
[facility] faxed refill request for the following medications:  escitalopram (LEXAPRO) 10 MG tablet   Please advise.  

## 2021-06-20 NOTE — Telephone Encounter (Signed)
Patient is over due for follow up appointment. Courtesy refill was already given 04/13/2021. Patient was advised at that time that office visit is needed before anymore refills. A message was also sent to patient back in 11/2020 that he needs an appointment, but has not called the office to schedule follow up appointment.

## 2021-06-27 ENCOUNTER — Telehealth: Payer: Self-pay | Admitting: Adult Health

## 2021-06-27 ENCOUNTER — Other Ambulatory Visit: Payer: Self-pay

## 2021-06-27 NOTE — Telephone Encounter (Signed)
[facility] faxed refill request for the following medications:  escitalopram (LEXAPRO) 10 MG tablet   Please advise.  

## 2021-06-28 NOTE — Telephone Encounter (Signed)
Left message for patient to call office back, appt is needed prior to any refills. KW
